# Patient Record
Sex: Male | Born: 2005 | Hispanic: No | Marital: Single | Smoking: Never smoker
Health system: Southern US, Community
[De-identification: ages and names within clinical notes are randomized; demographics above are authoritative.]

---
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
## 2023-12-09 DIAGNOSIS — L80 Vitiligo: Secondary | ICD-10-CM | POA: Diagnosis not present

## 2023-12-09 DIAGNOSIS — M545 Low back pain, unspecified: Secondary | ICD-10-CM | POA: Diagnosis not present

## 2023-12-09 DIAGNOSIS — Z0000 Encounter for general adult medical examination without abnormal findings: Secondary | ICD-10-CM | POA: Diagnosis not present

## 2023-12-09 DIAGNOSIS — Z13220 Encounter for screening for lipoid disorders: Secondary | ICD-10-CM | POA: Diagnosis not present

## 2023-12-09 DIAGNOSIS — Z111 Encounter for screening for respiratory tuberculosis: Secondary | ICD-10-CM | POA: Diagnosis not present

## 2023-12-09 DIAGNOSIS — Z68.41 Body mass index (BMI) pediatric, 85th percentile to less than 95th percentile for age: Secondary | ICD-10-CM | POA: Diagnosis not present

## 2023-12-11 DIAGNOSIS — R7611 Nonspecific reaction to tuberculin skin test without active tuberculosis: Secondary | ICD-10-CM | POA: Diagnosis not present

## 2023-12-11 DIAGNOSIS — Z0000 Encounter for general adult medical examination without abnormal findings: Secondary | ICD-10-CM | POA: Diagnosis not present
